# Patient Record
Sex: Female | Born: 2005 | Race: White | Hispanic: Yes | Marital: Single | Smoking: Never smoker
Health system: Southern US, Community
[De-identification: ages and names within clinical notes are randomized; demographics above are authoritative.]

## PROBLEM LIST (undated history)

## (undated) DIAGNOSIS — L211 Seborrheic infantile dermatitis: Secondary | ICD-10-CM

## (undated) DIAGNOSIS — R040 Epistaxis: Secondary | ICD-10-CM

## (undated) HISTORY — DX: Epistaxis: R04.0

## (undated) HISTORY — DX: Seborrheic infantile dermatitis: L21.1

---
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
## 2024-06-26 ENCOUNTER — Ambulatory Visit: Admitting: Skilled Nursing Facility1

## 2024-08-06 DIAGNOSIS — R070 Pain in throat: Secondary | ICD-10-CM | POA: Diagnosis not present
